# Patient Record
Sex: Female | Born: 2000 | Race: White | Hispanic: No | Marital: Single | Smoking: Never smoker
Health system: Southern US, Community
[De-identification: ages and names within clinical notes are randomized; demographics above are authoritative.]

---
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
## 2020-01-01 ENCOUNTER — Telehealth: Payer: Self-pay | Admitting: Internal Medicine

## 2020-01-01 NOTE — Telephone Encounter (Signed)
Spoke with pt's mother Lawson Fiscal (OK per [facility]) regarding pt having referral for possible POTS per PCP.  Mother is concerned about pt's left foot being cold to touch for a few hours.  She reports in was white and not purple and earlier in the day she had had muscle spasms in the same foot.  Advised mother, since pt has never been seen here before she would need to contact PCP for further evaluation as Dr Graciela Husbands is not in the office.  Pt's appt with him is scheduled for 01/09/20 at 3:45 p.m. Mother is aware.

## 2020-01-01 NOTE — Telephone Encounter (Signed)
New message:     Patient mother calling stating patient left food went very cold and to the touch. Patient will be a NP for Dr. Graciela Husbands. They would like to know what should they do. Please call back for a sooner apt as well.

## 2020-01-09 ENCOUNTER — Institutional Professional Consult (permissible substitution): Payer: No Typology Code available for payment source | Admitting: Internal Medicine

## 2020-01-23 ENCOUNTER — Other Ambulatory Visit: Payer: Self-pay

## 2020-01-23 ENCOUNTER — Telehealth (INDEPENDENT_AMBULATORY_CARE_PROVIDER_SITE_OTHER): Payer: No Typology Code available for payment source | Admitting: Internal Medicine

## 2020-01-23 ENCOUNTER — Telehealth: Payer: Self-pay

## 2020-01-23 VITALS — Ht 66.0 in | Wt 155.0 lb

## 2020-01-23 DIAGNOSIS — I422 Other hypertrophic cardiomyopathy: Secondary | ICD-10-CM

## 2020-01-23 NOTE — Patient Instructions (Addendum)
Medication Instructions: Your physician recommends that you continue on your current medications as directed. Please refer to the Current Medication list given to you today. *If you need a refill on your cardiac medications before your next appointment, please call your pharmacy*   Lab Work: None ordered.  If you have labs (blood work) drawn today and your tests are completely normal, you will receive your results only by: Marland Kitchen MyChart Message (if you have MyChart) OR . A paper copy in the mail If you have any lab test that is abnormal or we need to change your treatment, we will call you to review the results.   Testing/Procedures: EKG and Orthostatic Vital signs at Nurse visit scheduled for Wednesday 02/07/2020 at 11am  Your physician has requested that you have an echocardiogram. Echocardiography is a painless test that uses sound waves to create images of your heart. It provides your doctor with information about the size and shape of your heart and how well your heart's chambers and valves are working. This procedure takes approximately one hour. There are no restrictions for this procedure.     Follow-Up: At [facility], you and your health needs are our priority.  As part of our continuing mission to provide you with exceptional heart care, we have created designated Provider Care Teams.  These Care Teams include your primary Cardiologist (physician) and Advanced Practice Providers (APPs -  Physician Assistants and Nurse Practitioners) who all work together to provide you with the care you need, when you need it.  We recommend signing up for the patient portal called "MyChart".  Sign up information is provided on this After Visit Summary.  MyChart is used to connect with patients for Virtual Visits (Telemedicine).  Patients are able to view lab/test results, encounter notes, upcoming appointments, etc.  Non-urgent messages can be sent to your provider as well.   To learn more about  what you can do with MyChart, go to [email or web address].    Your next appointment:   04/08/2020 in the office with Dr Graciela Husbands

## 2020-01-23 NOTE — Progress Notes (Signed)
Electrophysiology TeleHealth Note   Due to national recommendations of social distancing due to COVID 19, an audio/video telehealth visit is felt to be most appropriate for this patient at this time.  See MyChart message from today for the patient's consent to telehealth for [facility].   Date:  01/23/2020   ID:  Jamie Dickson, DOB 10-Oct-2000, MRN [id number]  Location: patient's home  Provider location: [address] [place]  Evaluation Performed: Initial Evaluation  PCP:  Merri Brunette, MD  Cardiologist:  No primary care provider on file.   Electrophysiologist:  None   Chief Complaint:  *spells  History of Present Illness:    Jamie Dickson is a 20 y.o. female who presents via Web designer for a telehealth consultation today for  Spells;  Currently diagnosed with COVID, and has had many viral illnesses in the past including EBV  Long hx of orthostatic intolerance, shower and post exercise lightheadedness and recurrent syncope over years who over the last 4-5 months has had increasing episodes of LH and "being out of it."  Syncope had occurred with getting in trouble with her mom, this episode was exceedingly brief the others have lasted minutes  Stereotypical prodrome and prolonged recovery fatigue. Presyncopal episodes, music festival and coffee shop, similar prodrome Noted to be pale on 2/3 occasions    Diet is salt deplete, but better recently with use of Liquid IV ( 1 gm Na/day)  Fluid deplete  Exertional tachypalps and LH at top of stairs for example.  Sx not particularly worse with menses; has tolerated general illnesses poorly in the past, " dehydrated"  Following the two episodes above, she sought medical [facility] in [facility] withDx of anxiety and ? intercerebral hypertension   Recurrent spells of dizzness and Presyncope assoc with antecedent HA MRI >>told "fluid pressing on her optic nerve. She saw an ophthalmologist in [facility]  who told her that there was optic nerve swelling" ( per [facility] note) ... Personally reviewed...s no evidence of white matter disease on the brain MRI to suggest demyelination. The optic nerve sheaths do appear slightly distended and on sagittal view MRI there is suggestion of possible flattening of the globe although this is not apparent on axial images. No optic nerve tortuosity.  No evidence of mass lesion in the brain imaging to explain increased intracranial pressure  Underwent LP at [facility] with Opening pressure 19cm  (normal < 20 UpToDATE)   Rx w topiramate      No past medical history on file.  No past surgical history on file.  Current Outpatient Medications  Medication Sig Dispense Refill  . hydrOXYzine (ATARAX/VISTARIL) 25 MG tablet Take 25 mg by mouth 3 (three) times daily as needed for anxiety.    . fluticasone (FLONASE) 50 MCG/ACT nasal spray Place 2 sprays into both nostrils daily. (Patient not taking: Reported on 01/23/2020) 16 g 6   No current facility-administered medications for this visit.    Allergies:   Patient has no known allergies.   Social History:  The patient  reports that she has never smoked. She has never used smokeless tobacco. She reports that she does not drink alcohol and does not use drugs.   Family History:  The patient's   family history is not on file. without hx of dysautonomia or sudden death  ROS:  Please see the history of present illness.   All other systems are personally reviewed and negative.    Exam:  Vital Signs:  Ht 5\' 6"  (1.676 m)   Wt 155 lb (70.3 kg)   BMI 25.02 kg/m     Well appearing, alert and conversant, regular work of breathing,  good skin color Eyes- anicteric, neuro- grossly intact, skin- no apparent rash or lesions or cyanosis, mouth- oral mucosa is pink   Labs/Other Tests and Data Reviewed:    Recent Labs: No results found for requested labs within last 8760 hours.   Wt Readings from Last 3 Encounters:  01/23/20 155  lb (70.3 kg)  05/06/17 152 lb (68.9 kg) (86 %, Z= 1.07)*  10/09/16 154 lb (69.9 kg) (88 %, Z= 1.16)*   * Growth percentiles are based on CDC (Girls, 2-20 Years) data.     Other studies personally reviewed: Additional studies/ records that were reviewed today include:       ASSESSMENT & PLAN:    Dysautonomia  Syncope neurally mediated  Orthostatic intolerance Pale Left foot    The pts hx is consistent with dysautonomia, but we have no objective data today.   Still quite consistent and with modest improvement with the recent introduction of salt repletion We discussed extensively the issues of dysautonomia, the physiology of orthstasis and positional stress.  We discussed the role of salt and water repletion, the importance of exercise, often needing to be started in the recumbent position, and the awareness of triggers and the role of ambient heat and dehydration Discussed different salt supplement and the importance of fluid repletion and the potential benefits of compressive wear  reveiwed the physiology of exercise and the risks assoc with termination of exercise  One of her syncopal episodes, the first, was apparently very brief in duration, the others not so.  Given that , important to exclude LQTS as possible concomitant issue>> will get ECG   W exertional LH will need to get echo to exclude HCM  As relates to her L foot going transiently cold, no idea? ranynauds  Will follow and may need to see vascular if it recurs      COVID 19 screen The patient denies symptoms of COVID 19 at this time.  The importance of social distancing was discussed today.  Follow-up: 79m    Current medicines are reviewed at length with the patient today.   The patient does not have concerns regarding her medicines.  The following changes were made today:  none  Labs/ tests ordered today include: orthostatic vital signs to be done upon her return from the beach  No orders of the defined  types were placed in this encounter.   Future tests ( post COVID )     Patient Risk:  after full review of this patients clinical status, I feel that they are at moderate risk at this time.  Today, I have spent 69 minutes with the patient with telehealth technology discussing  (As above)  .    Signed, 1m, MD  01/23/2020 2:35 PM     [facility] [address] [place] [phone] (office) [phone] (fax)  [phone]

## 2020-01-23 NOTE — Telephone Encounter (Signed)
  Patient Consent for Virtual Visit         Jamie Dickson has provided verbal consent on 01/23/2020 for a virtual visit (video or telephone).   CONSENT FOR VIRTUAL VISIT FOR:  Jamie Dickson  By participating in this virtual visit I agree to the following:  I hereby voluntarily request, consent and authorize [facility] and its employed or contracted physicians, physician assistants, nurse practitioners or other licensed health care professionals (the Practitioner), to provide me with telemedicine health care services (the "Services") as deemed necessary by the treating Practitioner. I acknowledge and consent to receive the Services by the Practitioner via telemedicine. I understand that the telemedicine visit will involve communicating with the Practitioner through live audiovisual communication technology and the disclosure of certain medical information by electronic transmission. I acknowledge that I have been given the opportunity to request an in-person assessment or other available alternative prior to the telemedicine visit and am voluntarily participating in the telemedicine visit.  I understand that I have the right to withhold or withdraw my consent to the use of telemedicine in the course of my care at any time, without affecting my right to future care or treatment, and that the Practitioner or I may terminate the telemedicine visit at any time. I understand that I have the right to inspect all information obtained and/or recorded in the course of the telemedicine visit and may receive copies of available information for a reasonable fee.  I understand that some of the potential risks of receiving the Services via telemedicine include:  Marland Kitchen Delay or interruption in medical evaluation due to technological equipment failure or disruption; . Information transmitted may not be sufficient (e.g. poor resolution of images) to allow for appropriate medical decision making by the  Practitioner; and/or  . In rare instances, security protocols could fail, causing a breach of personal health information.  Furthermore, I acknowledge that it is my responsibility to provide information about my medical history, conditions and care that is complete and accurate to the best of my ability. I acknowledge that Practitioner's advice, recommendations, and/or decision may be based on factors not within their control, such as incomplete or inaccurate data provided by me or distortions of diagnostic images or specimens that may result from electronic transmissions. I understand that the practice of medicine is not an exact science and that Practitioner makes no warranties or guarantees regarding treatment outcomes. I acknowledge that a copy of this consent can be made available to me via my patient portal [facility] MyChart), or I can request a printed copy by calling the office of [facility].    I understand that my insurance will be billed for this visit.   I have read or had this consent read to me. . I understand the contents of this consent, which adequately explains the benefits and risks of the Services being provided via telemedicine.  . I have been provided ample opportunity to ask questions regarding this consent and the Services and have had my questions answered to my satisfaction. . I give my informed consent for the services to be provided through the use of telemedicine in my medical care

## 2020-02-07 ENCOUNTER — Ambulatory Visit (INDEPENDENT_AMBULATORY_CARE_PROVIDER_SITE_OTHER): Payer: No Typology Code available for payment source | Admitting: *Deleted

## 2020-02-07 ENCOUNTER — Other Ambulatory Visit: Payer: Self-pay

## 2020-02-07 DIAGNOSIS — G901 Familial dysautonomia [Riley-Day]: Secondary | ICD-10-CM

## 2020-02-07 NOTE — Progress Notes (Signed)
1.) Reason for visit: dizzy spells  2.) Name of MD requesting visit: Dr. Graciela Husbands  3.) H&P: see epic  4.) ROS related to problem: Othrostatic Vital signs, see EKG  5.) Assessment and plan per MD:  Dr. Katrinka Blazing reviewed, no orders given   Pt did report dizziness upon standing from lying  position (this is not abnormal she reports). Dizziness subsided as she stood longer

## 2020-02-07 NOTE — Patient Instructions (Signed)
Medication Instructions:  Your physician recommends that you continue on your current medications as directed. Please refer to the Current Medication list given to you today. *If you need a refill on your cardiac medications before your next appointment, please call your pharmacy*   Lab Work: If you have labs (blood work) drawn today and your tests are completely normal, you will receive your results only by: Marland Kitchen MyChart Message (if you have MyChart) OR . A paper copy in the mail If you have any lab test that is abnormal or we need to change your treatment, we will call you to review the results.   Follow-Up: At [facility], you and your health needs are our priority.  As part of our continuing mission to provide you with exceptional heart care, we have created designated Provider Care Teams.  These Care Teams include your primary Cardiologist (physician) and Advanced Practice Providers (APPs -  Physician Assistants and Nurse Practitioners) who all work together to provide you with the care you need, when you need it.  We recommend signing up for the patient portal called "MyChart".  Sign up information is provided on this After Visit Summary.  MyChart is used to connect with patients for Virtual Visits (Telemedicine).  Patients are able to view lab/test results, encounter notes, upcoming appointments, etc.  Non-urgent messages can be sent to your provider as well.   To learn more about what you can do with MyChart, go to [email or web address].    Your next appointment:   As scheduled

## 2020-02-12 ENCOUNTER — Ambulatory Visit (HOSPITAL_COMMUNITY): Payer: No Typology Code available for payment source | Attending: Cardiovascular Disease

## 2020-02-12 ENCOUNTER — Other Ambulatory Visit: Payer: Self-pay

## 2020-02-12 DIAGNOSIS — I422 Other hypertrophic cardiomyopathy: Secondary | ICD-10-CM | POA: Insufficient documentation

## 2020-02-12 LAB — ECHOCARDIOGRAM COMPLETE
Area-P 1/2: 4.68 cm2
S' Lateral: 2.8 cm

## 2020-02-14 NOTE — Addendum Note (Signed)
Addended by: Baird Lyons on: 02/14/2020 06:25 PM   Modules accepted: Orders

## 2020-04-08 ENCOUNTER — Ambulatory Visit: Payer: No Typology Code available for payment source | Admitting: Internal Medicine

## 2020-05-31 ENCOUNTER — Ambulatory Visit: Payer: No Typology Code available for payment source | Admitting: Internal Medicine

## 2020-07-04 ENCOUNTER — Telehealth: Payer: Self-pay | Admitting: Internal Medicine

## 2020-07-04 NOTE — Telephone Encounter (Signed)
Patient sent a message to scheduling requesting an appointment for worsening symptoms. Scheduled pt for 07/07 but she cannot do this week, rescheduled for 08/02 with Maxine Glenn. Patient stated she was having shortness of breath and dizziness. Called pt to get more information to send to triage but no answer, LVM to call office back. Just wanted to make the office aware.

## 2020-07-04 NOTE — Telephone Encounter (Signed)
Attempted phone call to pt and spoke with pt's mother, DPR and advised RN is returning pt call.  Pt's mother to have pt contact RN at [phone] if needed.

## 2020-07-11 ENCOUNTER — Ambulatory Visit: Payer: No Typology Code available for payment source | Admitting: Student

## 2020-08-05 NOTE — Progress Notes (Signed)
PCP:  Merri Brunette, MD Primary Cardiologist: None Electrophysiologist: Sherryl Manges, MD   Jamie Dickson is a 20 y.o. female seen today for Sherryl Manges, MD for acute visit due to SOB and dizziness .  Seen by Dr. Graciela Husbands remotely 01/23/2020 as she had COVID.  Long hx of orthostatic intolerance, shower and post exercise lightheadedness and recurrent syncope over years who over the last 4-5 months has had increasing episodes of LH and "being out of it."  Syncope had occurred with getting in trouble with her mom, this episode was exceedingly brief the others have lasted inutes  Stereotypical prodrome and prolonged recovery fatigue. Presyncopal episodes, music festival and coffee shop, similar prodrome. Noted to be pale on 2/3 occasions    Exertional tachypalps and LH at top of stairs for example.  Sx not particularly worse with menses; has tolerated general illnesses poorly in the past, " dehydrated"   Following the two episodes above, she [facility] medical [facility] in [facility] withDx of anxiety and ? intercerebral hypertension  Recurrent spells of dizzness and Presyncope assoc with antecedent HA MRI >>told "fluid pressing on her optic nerve. She saw an ophthalmologist in [facility] who told her that there was optic nerve swelling" ( per [facility] note) ... no evidence of white matter disease on the brain MRI to suggest demyelination. The optic nerve sheaths do appear slightly distended and on sagittal view MRI there is suggestion of possible flattening of the globe although this is not apparent on axial images. No optic nerve tortuosity.  No evidence of mass lesion in the brain imaging to explain increased intracranial pressure   Underwent LP at [facility] with Opening pressure 19cm  (normal < 20 UpToDATE)   Rx w topiramate  Since last being seen in our clinic the patient reports more symptoms over the past couple of months.  She has heart palpitations and chest discomfort/pressure about 2-4 times a week.  They aren't always related to each other. The episodes last for a few minutes at most and at times she will feel "washed out" for the rest of the day. It is occasional related to food, but the same meal does not always cause the symptoms to occur. Her example is [place] food almost always causes it, but [employer] intermittently does.  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 She states for the most part she gets through her very vigorous routine without symptoms, but it is very disconcerting if she has symptoms during a dance.   She drinks liquid IV daily, and 6-8 powerades on days she is working.  HR on apple watch range from 42 - 153 during a given day.   Orthostatic VS for the past 24 hrs (Last 3 readings):  BP- Lying Pulse- Lying BP- Sitting Pulse- Sitting BP- Standing at 0 minutes Pulse- Standing at 0 minutes BP- Standing at 3 minutes Pulse- Standing at 3 minutes  08/06/20 0940 117/68 70 111/71 71 110/67 73 117/72 81     Past Medical History Dysautonomia  Syncope neurally mediated  Orthostatic intolerance Pale Left foot  No past surgical history on file.  Current Outpatient Medications  Medication Sig Dispense Refill   fluticasone (FLONASE) 50 MCG/ACT nasal spray Place 2 sprays into both nostrils daily. (Patient not taking: Reported on 08/06/2020) 16 g 6   hydrOXYzine (ATARAX/VISTARIL) 25 MG tablet Take 25 mg by mouth 3 (three) times daily as needed for anxiety.     No current facility-administered medications  for this visit.    No Known Allergies  Social History   Socioeconomic History   Marital status: Single    Spouse name: Not on file   Number of children: Not on file   Years of education: Not on file   Highest education level: Not on file  Occupational History   Not on file  Tobacco Use   Smoking status: Never   Smokeless tobacco: Never  Vaping Use   Vaping Use: Never used  Substance and Sexual Activity   Alcohol use: No   Drug use: No   Sexual  activity: Not on file  Other Topics Concern   Not on file  Social History Narrative   Not on file   Social Determinants of Health   Financial Resource Strain: Not on file  Food Insecurity: Not on file  Transportation Needs: Not on file  Physical Activity: Not on file  Stress: Not on file  Social Connections: Not on file  Intimate Partner Violence: Not on file     Review of Systems: All other systems reviewed and are otherwise negative except as noted above.  Physical Exam: Vitals:   08/06/20 0939  BP: 113/69  Pulse: 68  SpO2: 98%  Weight: 157 lb 3.2 oz (71.3 kg)  Height: 5\' 6"  (1.676 m)    GEN- The patient is well appearing, alert and oriented x 3 today.   HEENT: normocephalic, atraumatic; sclera clear, conjunctiva pink; hearing intact; oropharynx clear; neck supple, no JVP Lymph- no cervical lymphadenopathy Lungs- Clear to ausculation bilaterally, normal work of breathing.  No wheezes, rales, rhonchi Heart- Regular rate and rhythm, no murmurs, rubs or gallops, PMI not laterally displaced GI- soft, non-tender, non-distended, bowel sounds present, no hepatosplenomegaly Extremities- no clubbing, cyanosis, or edema; DP/PT/radial pulses 2+ bilaterally MS- no significant deformity or atrophy Skin- warm and dry, no rash or lesion Psych- euthymic mood, full affect Neuro- strength and sensation are intact  EKG is ordered. Personal review of EKG from today shows NSR at 65 bpm with normal intervals  Additional studies reviewed include: Previous EP office notes.   Assessment and Plan:  Dysautonomia  Syncope neurally mediated  Orthostatic intolerance  Suspect HR variability on apple watch is related to ectopy.  With palpitations happening multiple times a week, we will order Zio for 7 days and address further based on results.   No further syncope. Qt NOT prolonged.  Re-visited importance of salt and water repletion. She should continue exercise. Trigger avoidance  glossed over today with no clear triggers at this time.  Will place Zio as above which may shed light on timing and relation of her symptoms.   , PA-C  08/06/20 10:23 AM

## 2020-08-06 ENCOUNTER — Ambulatory Visit (INDEPENDENT_AMBULATORY_CARE_PROVIDER_SITE_OTHER): Payer: No Typology Code available for payment source

## 2020-08-06 ENCOUNTER — Other Ambulatory Visit: Payer: Self-pay

## 2020-08-06 ENCOUNTER — Encounter: Payer: Self-pay | Admitting: Student

## 2020-08-06 ENCOUNTER — Ambulatory Visit (INDEPENDENT_AMBULATORY_CARE_PROVIDER_SITE_OTHER): Payer: No Typology Code available for payment source | Admitting: Student

## 2020-08-06 VITALS — BP 113/69 | HR 68 | Ht 66.0 in | Wt 157.2 lb

## 2020-08-06 DIAGNOSIS — R002 Palpitations: Secondary | ICD-10-CM

## 2020-08-06 DIAGNOSIS — G901 Familial dysautonomia [Riley-Day]: Secondary | ICD-10-CM

## 2020-08-06 LAB — BASIC METABOLIC PANEL
BUN/Creatinine Ratio: 10 (ref 9–23)
BUN: 10 mg/dL (ref 6–20)
CO2: 24 mmol/L (ref 20–29)
Calcium: 9.6 mg/dL (ref 8.7–10.2)
Chloride: 105 mmol/L (ref 96–106)
Creatinine, Ser: 1.04 mg/dL — ABNORMAL HIGH (ref 0.57–1.00)
Glucose: 55 mg/dL — ABNORMAL LOW (ref 65–99)
Potassium: 3.8 mmol/L (ref 3.5–5.2)
Sodium: 143 mmol/L (ref 134–144)
eGFR: 78 mL/min/{1.73_m2} (ref >59)

## 2020-08-06 LAB — CBC
Hematocrit: 37 % (ref 34.0–46.6)
Hemoglobin: 12.5 g/dL (ref 11.1–15.9)
MCH: 30.7 pg (ref 26.6–33.0)
MCHC: 33.8 g/dL (ref 31.5–35.7)
MCV: 91 fL (ref 79–97)
Platelets: 239 10*3/uL (ref 150–450)
RBC: 4.07 x10E6/uL (ref 3.77–5.28)
RDW: 11.9 % (ref 11.7–15.4)
WBC: 6.5 10*3/uL (ref 3.4–10.8)

## 2020-08-06 LAB — TSH: TSH: 1.71 u[IU]/mL (ref 0.450–4.500)

## 2020-08-06 NOTE — Patient Instructions (Signed)
Medication Instructions:  Your physician recommends that you continue on your current medications as directed. Please refer to the Current Medication list given to you today.  *If you need a refill on your cardiac medications before your next appointment, please call your pharmacy*   Lab Work: TODAY: BMET, CBC, TSH  If you have labs (blood work) drawn today and your tests are completely normal, you will receive your results only by: MyChart Message (if you have MyChart) OR A paper copy in the mail If you have any lab test that is abnormal or we need to change your treatment, we will call you to review the results. Follow-Up: At [facility], you and your health needs are our priority.  As part of our continuing mission to provide you with exceptional heart care, we have created designated Provider Care Teams.  These Care Teams include your primary Cardiologist (physician) and Advanced Practice Providers (APPs -  Physician Assistants and Nurse Practitioners) who all work together to provide you with the care you need, when you need it.   Your next appointment:   6 month(s)  The format for your next appointment:   In Person  Provider:   You may see Sherryl Manges, MD or one of the following Advanced Practice Providers on your designated Care Team:   Francis Dowse, [place] Casimiro Needle "Mardelle Matte" Lanna Poche, [place]   Other Instructions Christena Deem- Long Term Monitor Instructions  Your physician has requested you wear a ZIO patch monitor for 7 days.  This is a single patch monitor. [employer] supplies one patch monitor per enrollment. Additional stickers are not available. Please do not apply patch if you will be having a Nuclear Stress Test,  Echocardiogram, Cardiac CT, MRI, or Chest Xray during the period you would be wearing the  monitor. The patch cannot be worn during these tests. You cannot remove and re-apply the  ZIO XT patch monitor.  Your ZIO patch monitor will be mailed 3 day USPS to your address  on file. It may take 3-5 days  to receive your monitor after you have been enrolled.  Once you have received your monitor, please review the enclosed instructions. Your monitor  has already been registered assigning a specific monitor serial # to you.  Billing and Patient Assistance Program Information  We have supplied [employer] with any of your insurance information on file for billing purposes. [employer] offers a sliding scale Patient Assistance Program for patients that do not have  insurance, or whose insurance does not completely cover the cost of the ZIO monitor.  You must apply for the Patient Assistance Program to qualify for this discounted rate.  To apply, please call [employer] at [phone], select option 4, select option 2, ask to apply for  Patient Assistance Program. Meredeth Ide will ask your household income, and how many people  are in your household. They will quote your out-of-pocket cost based on that information.  [employer] will also be able to set up a 19-month, interest-free payment plan if needed.  Applying the monitor   Shave hair from upper left chest.  Hold abrader disc by orange tab. Rub abrader in 40 strokes over the upper left chest as  indicated in your monitor instructions.  Clean area with 4 enclosed alcohol pads. Let dry.  Apply patch as indicated in monitor instructions. Patch will be placed under collarbone on left  side of chest with arrow pointing upward.  Rub patch adhesive wings for 2 minutes. Remove white label marked "1". Remove  the white  label marked "2". Rub patch adhesive wings for 2 additional minutes.  While looking in a mirror, press and release button in center of patch. A small green light will  flash 3-4 times. This will be your only indicator that the monitor has been turned on.  Do not shower for the first 24 hours. You may shower after the first 24 hours.  Press the button if you feel a symptom. You will hear a small click. Record Date, Time  and  Symptom in the Patient Logbook.  When you are ready to remove the patch, follow instructions on the last 2 pages of Patient  Logbook. Stick patch monitor onto the last page of Patient Logbook.  Place Patient Logbook in the blue and white box. Use locking tab on box and tape box closed  securely. The blue and white box has prepaid postage on it. Please place it in the mailbox as  soon as possible. Your physician should have your test results approximately 7 days after the  monitor has been mailed back to [facility].  Call [facility] Customer Care at [phone] if you have questions regarding  your ZIO XT patch monitor. Call them immediately if you see an orange light blinking on your  monitor.  If your monitor falls off in less than 4 days, contact our Monitor department at [phone].  If your monitor becomes loose or falls off after 4 days call [employer] at [phone] for  suggestions on securing your monitor

## 2020-08-06 NOTE — Progress Notes (Unsigned)
Enrolled patient for a 7 day Zio XT Monitor to be mailed to patients home  Dr. Graciela Husbands to read

## 2020-08-15 DIAGNOSIS — R002 Palpitations: Secondary | ICD-10-CM | POA: Diagnosis not present

## 2020-08-23 ENCOUNTER — Ambulatory Visit: Payer: No Typology Code available for payment source | Admitting: Internal Medicine

## 2021-01-31 ENCOUNTER — Ambulatory Visit: Payer: No Typology Code available for payment source | Admitting: Internal Medicine

## 2021-03-03 DIAGNOSIS — I951 Orthostatic hypotension: Secondary | ICD-10-CM | POA: Insufficient documentation

## 2021-03-04 ENCOUNTER — Ambulatory Visit: Payer: No Typology Code available for payment source | Admitting: Internal Medicine

## 2021-03-04 ENCOUNTER — Other Ambulatory Visit: Payer: Self-pay

## 2021-03-04 VITALS — BP 116/78 | HR 82 | Ht 66.0 in | Wt 147.0 lb

## 2021-03-04 DIAGNOSIS — G901 Familial dysautonomia [Riley-Day]: Secondary | ICD-10-CM

## 2021-03-04 DIAGNOSIS — I951 Orthostatic hypotension: Secondary | ICD-10-CM

## 2021-03-04 NOTE — Progress Notes (Signed)
° ° ° ° °  Patient Care Team: Merri Brunette, MD as PCP - General (Family Medicine) [facility] Salvia, MD as PCP - Electrophysiology (Cardiology)   HPI  Jamie Dickson is a 21 y.o. female seen in follow-up for dysautonomia consisting of neurally mediated syncope and orthostatic intolerance who was originally seen by telehealth 1/22.  Intercurrently diagnosed with COVID at that time, prior history of EBV.  Extensive neurological evaluation as outlined in the original note including MRIs, neuro-ophthalmological evaluation, measurement of intracranial pressure.  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  Some orthostatic dizziness.  Symptoms are aggravated from the time of her menses.  She has had a rash times to look sort of like libido on her right arm.  Also is having some purplish discoloration of her toes and some coolness of her right pinky.  Uses alcohol rarely  Anxiety in the past has been treated with hydroxyzine   DATE TEST EF   2/22 Echo   55-65 %              Date Cr K Hgb TSH  8/22 1.04 3.8 12.5  1.71             Records and Results Reviewed   No past medical history on file.  No past surgical history on file.  No outpatient medications have been marked as taking for the 03/04/21 encounter (Office Visit) with [facility] Salvia, MD.    No Known Allergies    Review of Systems negative except from HPI and PMH  Physical Exam BP 116/78    Pulse 82    Ht 5\' 6"  (1.676 m)    Wt 147 lb (66.7 kg)    SpO2 93%    BMI 23.73 kg/m  Well developed and well nourished in no acute distress HENT normal E scleral and icterus clear Neck Supple JVP flat; carotids brisk and full Clear to ausculation Regular rate and rhythm, no murmurs gallops or rub Soft with active bowel sounds No clubbing cyanosis  Edema Alert and oriented, grossly normal motor and sensory  function Skin Warm and Dry  ECG    CrCl cannot be calculated (Patient's most recent lab result is older than the maximum 21 days allowed.).   Assessment and  Plan  Dysautonomia  Palpitations  Anxiety  Dysautonomia is significantly less symptomatic than previously.  We have again reviewed the importance of salt and water repletion with a goal of  2 g of sodium per day.  Discussed again the potential benefits of abdominal compression.  Encouraged her to be attentive to her anxieties and also to consider with her mom the potential benefits of hormonal suppression of her menses as her symptoms are considerably worse around this time.  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  Stressed the importance of being well-hydrated as the temperature warms up.     Current medicines are reviewed at length with the patient today .  The patient does not  have concerns regarding medicines.

## 2021-03-04 NOTE — Patient Instructions (Signed)
